# Patient Record
Sex: Male | Born: 1985 | Race: Black or African American | Hispanic: No | Marital: Single | Smoking: Former smoker
Health system: Southern US, Community
[De-identification: ages and names within clinical notes are randomized; demographics above are authoritative.]

## PROBLEM LIST (undated history)

## (undated) DIAGNOSIS — I10 Essential (primary) hypertension: Secondary | ICD-10-CM

---
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
## 2007-03-16 ENCOUNTER — Emergency Department: Payer: Self-pay | Admitting: Emergency Medicine

## 2010-08-27 ENCOUNTER — Emergency Department: Payer: Self-pay | Admitting: Emergency Medicine

## 2010-08-30 ENCOUNTER — Emergency Department: Payer: Self-pay | Admitting: Emergency Medicine
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
## 2012-02-20 ENCOUNTER — Emergency Department: Payer: Self-pay | Admitting: Emergency Medicine

## 2013-05-07 ENCOUNTER — Emergency Department: Payer: Self-pay | Admitting: Emergency Medicine

## 2015-09-30 ENCOUNTER — Other Ambulatory Visit: Payer: Self-pay | Admitting: Nephrology

## 2015-09-30 DIAGNOSIS — R944 Abnormal results of kidney function studies: Secondary | ICD-10-CM

## 2015-10-06 ENCOUNTER — Ambulatory Visit: Payer: Self-pay

## 2016-12-11 ENCOUNTER — Ambulatory Visit (HOSPITAL_COMMUNITY)
Admission: EM | Admit: 2016-12-11 | Discharge: 2016-12-11 | Disposition: A | Discharge status: Home / Self Care | Payer: Self-pay | Attending: Family Medicine | Admitting: Family Medicine

## 2016-12-11 ENCOUNTER — Encounter (HOSPITAL_COMMUNITY): Payer: Self-pay | Admitting: Emergency Medicine

## 2016-12-11 DIAGNOSIS — Z202 Contact with and (suspected) exposure to infections with a predominantly sexual mode of transmission: Secondary | ICD-10-CM

## 2016-12-11 DIAGNOSIS — Z113 Encounter for screening for infections with a predominantly sexual mode of transmission: Secondary | ICD-10-CM

## 2016-12-11 DIAGNOSIS — F17210 Nicotine dependence, cigarettes, uncomplicated: Secondary | ICD-10-CM | POA: Insufficient documentation

## 2016-12-11 MED ORDER — METRONIDAZOLE 500 MG PO TABS: 500.0000 mg | ORAL_TABLET | Freq: Two times a day (BID) | ORAL | 0 refills | Status: AC

## 2016-12-11 NOTE — ED Provider Notes (Signed)
CSN: [id number]     Arrival date & time 12/11/16  1349 History   First MD Initiated Contact with Patient 12/11/16 1447     Chief Complaint  Patient presents with  . Exposure to STD   (Consider location/radiation/quality/duration/timing/severity/associated sxs/prior Treatment) Patient states he was told by his sexual partner that she tested positive for trich at the doctors office and he wants to be treated and be tested.  He denies any sx's.   The history is provided by the patient.  Exposure to STD  This is a new problem. The problem occurs constantly. The problem has not changed since onset.Nothing aggravates the symptoms. Nothing relieves the symptoms. He has tried nothing for the symptoms.    History reviewed. No pertinent past medical history. History reviewed. No pertinent surgical history. History reviewed. No pertinent family history. Social History  Substance Use Topics  . Smoking status: Current Some Day Smoker    Packs/day: 0.50    Years: 3.00    Types: Cigarettes  . Smokeless tobacco: Never Used  . Alcohol use No    Review of Systems  Constitutional: Negative.   HENT: Negative.   Eyes: Negative.   Respiratory: Negative.   Cardiovascular: Negative.   Gastrointestinal: Negative.   Endocrine: Negative.   Genitourinary: Negative.   Musculoskeletal: Negative.   Allergic/Immunologic: Negative.   Neurological: Negative.   Hematological: Negative.     Allergies  Patient has no known allergies.  Home Medications   Prior to Admission medications   Medication Sig Start Date End Date Taking? Authorizing Provider  metroNIDAZOLE (FLAGYL) 500 MG tablet Take 1 tablet (500 mg total) by mouth 2 (two) times daily. 12/11/16   Deatra CanterWilliam J Cianna Kasparian, FNP   Meds Ordered and Administered this Visit  Medications - No data to display  BP 152/100 (BP Location: Right Arm)   Pulse 110   Temp 98.7 F (37.1 C) (Oral)   Resp 18   SpO2 99%  No data found.   Physical Exam   Constitutional: He is oriented to person, place, and time. He appears well-developed and well-nourished.  HENT:  Head: Normocephalic and atraumatic.  Eyes: Conjunctivae and EOM are normal. Pupils are equal, round, and reactive to light.  Neck: Normal range of motion. Neck supple.  Cardiovascular: Normal rate, regular rhythm and normal heart sounds.   Pulmonary/Chest: Effort normal and breath sounds normal.  Abdominal: Soft. Bowel sounds are normal.  Genitourinary: Penis normal.  Musculoskeletal: Normal range of motion.  Neurological: He is alert and oriented to person, place, and time.  Nursing note and vitals reviewed.   Urgent Care Course     Procedures (including critical care time)  Labs Review Labs Reviewed  URINE CYTOLOGY ANCILLARY ONLY    Imaging Review No results found.   Visual Acuity Review  Right Eye Distance:   Left Eye Distance:   Bilateral Distance:    Right Eye Near:   Left Eye Near:    Bilateral Near:         MDM   1. Possible exposure to STD   2. Screen for STD (sexually transmitted disease)   3. Trichomonas contact    Flagyl 500mg  one po bid x 7 days #14  Urine cytology GC/ Chlamydia Trick    Deatra CanterWilliam J Ainsleigh Kakos, FNP 12/11/16 1456

## 2016-12-11 NOTE — Discharge Instructions (Signed)
Trichomoniasis °Trichomoniasis is an infection caused by an organism called Trichomonas. The infection can affect both women and men. In women, the outer male genitalia and the vagina are affected. In men, the penis is mainly affected, but the prostate and other reproductive organs can also be involved. Trichomoniasis is a sexually transmitted infection (STI) and is most often passed to another person through sexual contact.  °RISK FACTORS °Having unprotected sexual intercourse. °Having sexual intercourse with an infected partner. °SIGNS AND SYMPTOMS  °Symptoms of trichomoniasis in women include: °Abnormal gray-green frothy vaginal discharge. °Itching and irritation of the vagina. °Itching and irritation of the area outside the vagina. °Symptoms of trichomoniasis in men include:  °Penile discharge with or without pain. °Pain during urination. This results from inflammation of the urethra. °DIAGNOSIS  °Trichomoniasis may be found during a Pap test or physical exam. Your health care provider may use one of the following methods to help diagnose this infection: °Testing the pH of the vagina with a test tape. °Using a vaginal swab test that checks for the Trichomonas organism. A test is available that provides results within a few minutes. °Examining a urine sample. °Testing vaginal secretions. °Your health care provider may test you for other STIs, including HIV. °TREATMENT  °You may be given medicine to fight the infection. Women should inform their health care provider if they could be or are pregnant. Some medicines used to treat the infection should not be taken during pregnancy. °Your health care provider may recommend over-the-counter medicines or creams to decrease itching or irritation. °Your sexual partner will need to be treated if infected. °Your health care provider may test you for infection again 3 months after treatment. °HOME CARE INSTRUCTIONS  °Take medicines only as directed by your health care  provider. °Take over-the-counter medicine for itching or irritation as directed by your health care provider. °Do not have sexual intercourse while you have the infection. °Women should not douche or wear tampons while they have the infection. °Discuss your infection with your partner. Your partner may have gotten the infection from you, or you may have gotten it from your partner. °Have your sex partner get examined and treated if necessary. °Practice safe, informed, and protected sex. °See your health care provider for other STI testing. °SEEK MEDICAL CARE IF:  °You still have symptoms after you finish your medicine. °You develop abdominal pain. °You have pain when you urinate. °You have bleeding after sexual intercourse. °You develop a rash. °Your medicine makes you sick or makes you throw up (vomit). °MAKE SURE YOU: °Understand these instructions. °Will watch your condition. °Will get help right away if you are not doing well or get worse. °This information is not intended to replace advice given to you by your health care provider. Make sure you discuss any questions you have with your health care provider. °Document Released: 05/01/2001 Document Revised: 11/26/2014 Document Reviewed: 08/17/2013 °Elsevier Interactive Patient Education © 2017 [employer] °  °

## 2016-12-11 NOTE — ED Triage Notes (Signed)
[facility] a complaint of an exposure to an STD. The patient reported that one of his sexual partners advised him that she tested positive for Trichomonas. The patient denied any symptoms at this time.

## 2016-12-12 ENCOUNTER — Telehealth (HOSPITAL_COMMUNITY): Payer: Self-pay | Admitting: Internal Medicine

## 2016-12-12 LAB — URINE CYTOLOGY ANCILLARY ONLY
Chlamydia: POSITIVE — AB
Neisseria Gonorrhea: NEGATIVE
Trichomonas: NEGATIVE

## 2016-12-12 MED ORDER — AZITHROMYCIN 500 MG PO TABS: 1000.0000 mg | ORAL_TABLET | Freq: Once | ORAL | 0 refills | Status: AC

## 2016-12-12 NOTE — Telephone Encounter (Signed)
Clinical staff, please let patient and health department know that test for chlamydia was positive.  Rx zithromax was sent to the pharmacy of record, [facility] on [address] in [place].  Sexual partners need to be notified and tested/treated.  Recheck for further evaluation as needed.  LM

## 2017-09-02 ENCOUNTER — Emergency Department
Admission: EM | Admit: 2017-09-02 | Discharge: 2017-09-02 | Disposition: A | Discharge status: Home / Self Care | Payer: Self-pay | Attending: Emergency Medicine | Admitting: Emergency Medicine

## 2017-09-02 ENCOUNTER — Encounter: Payer: Self-pay | Admitting: Emergency Medicine

## 2017-09-02 DIAGNOSIS — F17210 Nicotine dependence, cigarettes, uncomplicated: Secondary | ICD-10-CM | POA: Insufficient documentation

## 2017-09-02 DIAGNOSIS — W57XXXA Bitten or stung by nonvenomous insect and other nonvenomous arthropods, initial encounter: Secondary | ICD-10-CM

## 2017-09-02 DIAGNOSIS — L089 Local infection of the skin and subcutaneous tissue, unspecified: Secondary | ICD-10-CM | POA: Insufficient documentation

## 2017-09-02 DIAGNOSIS — X58XXXA Exposure to other specified factors, initial encounter: Secondary | ICD-10-CM | POA: Insufficient documentation

## 2017-09-02 DIAGNOSIS — I10 Essential (primary) hypertension: Secondary | ICD-10-CM | POA: Insufficient documentation

## 2017-09-02 DIAGNOSIS — Z79899 Other long term (current) drug therapy: Secondary | ICD-10-CM | POA: Insufficient documentation

## 2017-09-02 DIAGNOSIS — Y999 Unspecified external cause status: Secondary | ICD-10-CM | POA: Insufficient documentation

## 2017-09-02 DIAGNOSIS — Y939 Activity, unspecified: Secondary | ICD-10-CM | POA: Insufficient documentation

## 2017-09-02 DIAGNOSIS — Y929 Unspecified place or not applicable: Secondary | ICD-10-CM | POA: Insufficient documentation

## 2017-09-02 DIAGNOSIS — S70361A Insect bite (nonvenomous), right thigh, initial encounter: Secondary | ICD-10-CM | POA: Insufficient documentation

## 2017-09-02 MED ORDER — NAPROXEN 500 MG PO TABS: 500.0000 mg | ORAL_TABLET | Freq: Two times a day (BID) | ORAL | Status: DC

## 2017-09-02 MED ORDER — NAPROXEN 500 MG PO TABS: 500.0000 mg | ORAL_TABLET | Freq: Two times a day (BID) | ORAL | Status: AC

## 2017-09-02 MED ORDER — SULFAMETHOXAZOLE-TRIMETHOPRIM 800-160 MG PO TABS
1.0000 | ORAL_TABLET | Freq: Once | ORAL | Status: AC
  Administered 2017-09-02: 1 via ORAL
  Filled 2017-09-02: qty 1

## 2017-09-02 MED ORDER — CLONIDINE HCL 0.1 MG PO TABS
0.2000 mg | ORAL_TABLET | Freq: Once | ORAL | Status: AC
  Administered 2017-09-02: 0.2 mg via ORAL
  Filled 2017-09-02: qty 2

## 2017-09-02 MED ORDER — SULFAMETHOXAZOLE-TRIMETHOPRIM 800-160 MG PO TABS: 1.0000 | ORAL_TABLET | Freq: Two times a day (BID) | ORAL | 0 refills | Status: DC

## 2017-09-02 MED ORDER — HYDROCHLOROTHIAZIDE 50 MG PO TABS: 50.0000 mg | ORAL_TABLET | Freq: Every day | ORAL | 0 refills | Status: AC

## 2017-09-02 MED ORDER — SULFAMETHOXAZOLE-TRIMETHOPRIM 800-160 MG PO TABS: 1.0000 | ORAL_TABLET | Freq: Two times a day (BID) | ORAL | 0 refills | Status: AC

## 2017-09-02 MED ORDER — NAPROXEN 500 MG PO TABS
500.0000 mg | ORAL_TABLET | Freq: Once | ORAL | Status: AC
Start: 2017-09-02 — End: 2017-09-02
  Administered 2017-09-02: 500 mg via ORAL
  Filled 2017-09-02: qty 1

## 2017-09-02 NOTE — ED Provider Notes (Signed)
[facility] Emergency Department Provider Note   ____________________________________________   First MD Initiated Contact with Patient 09/02/17 1411     (approximate)  I have reviewed the triage vital signs and the nursing notes.   HISTORY  Chief Complaint Abscess    HPI Robert Juarez is a 31 y.o. male patient presents with for red swollen area to the anterior right thigh. Patient stated medical bed by to insect bite 5 days ago. Patient stated 2 days ago he tried to express fluid from the site resulting in increased swelling and redness.patient rates pain as a 10 over 10. Patient had a pain as "achy". Patient also presented with a blood pressure 189/130. Patient admits to noncompliance of unknown blood pressure medication for over 6 months. Review of patient's medical record does not name a blood pressure medication.   History reviewed. No pertinent past medical history.  There are no active problems to display for this patient.   History reviewed. No pertinent surgical history.  Prior to Admission medications   Medication Sig Start Date End Date Taking? Authorizing Provider  hydrochlorothiazide (HYDRODIURIL) 50 MG tablet Take 1 tablet (50 mg total) by mouth daily. 09/02/17   Joni Reining, PA-C  metroNIDAZOLE (FLAGYL) 500 MG tablet Take 1 tablet (500 mg total) by mouth 2 (two) times daily. 12/11/16   Deatra Canter, FNP  naproxen (NAPROSYN) 500 MG tablet Take 1 tablet (500 mg total) by mouth 2 (two) times daily with a meal. 09/02/17   Joni Reining, PA-C  sulfamethoxazole-trimethoprim (BACTRIM DS,SEPTRA DS) 800-160 MG tablet Take 1 tablet by mouth 2 (two) times daily. 09/02/17   Joni Reining, PA-C    Allergies Patient has no known allergies.  No family history on file.  Social History Social History  Substance Use Topics  . Smoking status: Current Some Day Smoker    Packs/day: 0.50    Years: 3.00    Types: Cigarettes  . Smokeless  tobacco: Never Used  . Alcohol use No    Review of Systems Constitutional: No fever/chills Eyes: No visual changes. ENT: No sore throat. Cardiovascular: Denies chest pain. Respiratory: Denies shortness of breath. Gastrointestinal: No abdominal pain.  No nausea, no vomiting.  No diarrhea.  No constipation. Genitourinary: Negative for dysuria. Musculoskeletal: Negative for back pain. Skin: redness and swelling right anterior thigh Neurological: Negative for headaches, focal weakness or numbness. Endocrine:hypertension ____________________________________________   PHYSICAL EXAM:  VITAL SIGNS: ED Triage Vitals  Enc Vitals Group     BP 09/02/17 1244 (!) 215/115     Pulse Rate 09/02/17 1244 (!) 101     Resp 09/02/17 1244 18     Temp 09/02/17 1244 98.8 F (37.1 C)     Temp Source 09/02/17 1244 Oral     SpO2 09/02/17 1244 98 %     Weight 09/02/17 1241 260 lb (117.9 kg)     Height 09/02/17 1241  (1.778 m)     Head Circumference --      Peak Flow --      Pain Score 09/02/17 1241 10     Pain Loc --      Pain Edu? --      Excl. in GC? --    Constitutional: Alert and oriented. Well appearing and in no acute distress. Hematological/Lymphatic/Immunilogical: No cervical lymphadenopathy. Cardiovascular: Normal rate, regular rhythm. Grossly normal heart sounds.  Good peripheral circulation.elevated blood pressure Respiratory: Normal respiratory effort.  No retractions. Lungs CTAB. Gastrointestinal: Soft  and nontender. No distention. No abdominal bruits. No CVA tenderness. Musculoskeletal: No lower extremity tenderness nor edema.  No joint effusions. Neurologic:  Normal speech and language. No gross focal neurologic deficits are appreciated. No gait instability. Skin:  Papular lesion on erythematous edematous base of the right anterior thigh. Psychiatric: Mood and affect are normal. Speech and behavior are normal.  ____________________________________________   LABS (all  labs ordered are listed, but only abnormal results are displayed)  Labs Reviewed - No data to display ____________________________________________  EKG   ____________________________________________  RADIOLOGY  No results found.  ____________________________________________   PROCEDURES  Procedure(s) performed: None  Procedures  Critical Care performed: No  ____________________________________________   INITIAL IMPRESSION / ASSESSMENT AND PLAN / ED COURSE  As part of my medical decision making, I reviewed the following data within the electronic MEDICAL RECORD NUMBER    Patient presents pain to right thigh secondary to infected insect bite. Patient also hypertension with positive medication. Patient advised to [facility] and take medications as directed.      ____________________________________________   FINAL CLINICAL IMPRESSION(S) / ED DIAGNOSES  Final diagnoses:  Bug bite with infection, initial encounter  Essential hypertension      NEW MEDICATIONS STARTED DURING THIS VISIT:  New Prescriptions   HYDROCHLOROTHIAZIDE (HYDRODIURIL) 50 MG TABLET    Take 1 tablet (50 mg total) by mouth daily.   NAPROXEN (NAPROSYN) 500 MG TABLET    Take 1 tablet (500 mg total) by mouth 2 (two) times daily with a meal.   SULFAMETHOXAZOLE-TRIMETHOPRIM (BACTRIM DS,SEPTRA DS) 800-160 MG TABLET    Take 1 tablet by mouth 2 (two) times daily.     Note:  This document was prepared using Dragon voice recognition software and may include unintentional dictation errors.    Joni Reining, PA-C 09/02/17 1521    Jene Every, MD 09/05/17 [phone]

## 2017-09-02 NOTE — ED Triage Notes (Signed)
Patient presents to the ED with reddened swollen area to his right thigh.  Patient states, "I think it might be an insect bite."  Patient reports noticing area approx. 2 days ago.  Patient is in no obvious distress at this time.

## 2017-09-02 NOTE — ED Notes (Signed)
See triage note.  Pt has an area of redness to right upper thigh.  Pt reports he thinks it is an insect bite, but is unsure.  Area red and warm to touch.  Pt in NAD, A&Ox4, ambulatory from triage.

## 2021-06-12 ENCOUNTER — Encounter: Payer: Self-pay | Admitting: *Deleted

## 2021-06-12 ENCOUNTER — Other Ambulatory Visit: Payer: Self-pay

## 2021-06-12 DIAGNOSIS — Z87891 Personal history of nicotine dependence: Secondary | ICD-10-CM | POA: Insufficient documentation

## 2021-06-12 DIAGNOSIS — L03319 Cellulitis of trunk, unspecified: Secondary | ICD-10-CM | POA: Insufficient documentation

## 2021-06-12 DIAGNOSIS — I10 Essential (primary) hypertension: Secondary | ICD-10-CM | POA: Insufficient documentation

## 2021-06-12 DIAGNOSIS — Z79899 Other long term (current) drug therapy: Secondary | ICD-10-CM | POA: Insufficient documentation

## 2021-06-12 NOTE — ED Triage Notes (Signed)
Pt has swelling redness to left nipple/breast area for 3 days.  No discharge from nipple.  Area tender to touch.  Pt alert

## 2021-06-13 ENCOUNTER — Emergency Department
Admission: EM | Admit: 2021-06-13 | Discharge: 2021-06-13 | Disposition: A | Discharge status: Home / Self Care | Payer: Self-pay | Attending: Emergency Medicine | Admitting: Emergency Medicine

## 2021-06-13 ENCOUNTER — Encounter: Payer: Self-pay | Admitting: Emergency Medicine

## 2021-06-13 DIAGNOSIS — L03319 Cellulitis of trunk, unspecified: Secondary | ICD-10-CM

## 2021-06-13 HISTORY — DX: Essential (primary) hypertension: I10

## 2021-06-13 MED ORDER — CEPHALEXIN 500 MG PO CAPS
500.0000 mg | ORAL_CAPSULE | Freq: Once | ORAL | Status: AC
  Administered 2021-06-13: 500 mg via ORAL
  Filled 2021-06-13: qty 1

## 2021-06-13 MED ORDER — CEPHALEXIN 500 MG PO CAPS: 500.0000 mg | ORAL_CAPSULE | Freq: Three times a day (TID) | ORAL | 0 refills | Status: AC

## 2021-06-13 NOTE — Discharge Instructions (Signed)
1.  Take antibiotic as prescribed (Keflex 500 mg  3 times daily x7 days). 2.  Apply warm, moist compress to affected area several times daily. 3.  Return to the ER for worsening symptoms, persistent vomiting, difficulty breathing or other concerns.

## 2021-06-13 NOTE — ED Notes (Signed)
NAD noted at time of D/C. Pt denies questions or concerns. Pt ambulatory to the lobby at this time. Return precautions reviewed with patient at this time, pt instructed to f/u with PCP for his HTN, EDP aware of BP at time of D/C.

## 2021-06-13 NOTE — ED Provider Notes (Signed)
[facility] Emergency Department Provider Note   ____________________________________________   Event Date/Time   First MD Initiated Contact with Patient 06/13/21 0235     (approximate)  I have reviewed the triage vital signs and the nursing notes.   HISTORY  Chief Complaint Breast Problem    HPI Robert Juarez is a 35 y.o. male who presents to the ED from home with a chief complaint of redness and swelling to his left nipple/breast area x3 days.  Denies injury, biting, scratching, known insect bite.  Does not have piercing.  Denies fever, chills, malaise, nausea, vomiting or dizziness.  Denies nipple discharge.     Past Medical History:  Diagnosis Date  . Hypertension     There are no problems to display for this patient.   No past surgical history on file.  Prior to Admission medications   Medication Sig Start Date End Date Taking? Authorizing Provider  cephALEXin (KEFLEX) 500 MG capsule Take 1 capsule (500 mg total) by mouth 3 (three) times daily. 06/13/21  Yes Irean Hong, MD  hydrochlorothiazide (HYDRODIURIL) 50 MG tablet Take 1 tablet (50 mg total) by mouth daily. 09/02/17   Joni Reining, PA-C  metroNIDAZOLE (FLAGYL) 500 MG tablet Take 1 tablet (500 mg total) by mouth 2 (two) times daily. 12/11/16   Deatra Canter, FNP  naproxen (NAPROSYN) 500 MG tablet Take 1 tablet (500 mg total) by mouth 2 (two) times daily with a meal. 09/02/17   Joni Reining, PA-C  sulfamethoxazole-trimethoprim (BACTRIM DS,SEPTRA DS) 800-160 MG tablet Take 1 tablet by mouth 2 (two) times daily. 09/02/17   Joni Reining, PA-C    Allergies Patient has no known allergies.  No family history on file.  Social History Social History   Tobacco Use  . Smoking status: Former    Packs/day: 0.50    Years: 3.00    Pack years: 1.50    Types: Cigarettes  . Smokeless tobacco: Never  Substance Use Topics  . Alcohol use: Yes  . Drug use: No    Review of  Systems  Constitutional: No fever/chills Eyes: No visual changes. ENT: No sore throat. Cardiovascular: Denies chest pain. Respiratory: Denies shortness of breath. Gastrointestinal: No abdominal pain.  No nausea, no vomiting.  No diarrhea.  No constipation. Genitourinary: Negative for dysuria. Musculoskeletal: Negative for back pain. Skin: Positive for redness/swelling to left nipple.  Negative for rash. Neurological: Negative for headaches, focal weakness or numbness.   ____________________________________________   PHYSICAL EXAM:  VITAL SIGNS: ED Triage Vitals [06/12/21 2326]  Enc Vitals Group     BP (!) 164/137     Pulse Rate 77     Resp 18     Temp 98.6 F (37 C)     Temp Source Oral     SpO2 99 %     Weight 240 lb (108.9 kg)     Height 5\' 10"  (1.778 m)     Head Circumference      Peak Flow      Pain Score 7     Pain Loc      Pain Edu?      Excl. in GC?     Constitutional: Alert and oriented. Well appearing and in no acute distress. Eyes: Conjunctivae are normal. PERRL. EOMI. Head: Atraumatic. Nose: No congestion/rhinnorhea. Mouth/Throat: Mucous membranes are moist.   Neck: No stridor.   Cardiovascular: Normal rate, regular rhythm. Grossly normal heart sounds.  Good peripheral circulation. Respiratory: Normal  respiratory effort.  No retractions. Lungs CTAB. Breasts: Right breast unremarkable.  Left breast: Unremarkable nipple.  No nipple discharge.  Surrounding areolar region with mild erythema and warmth.  No streaking.  No fluctuance.  No left axillary lymphadenopathy.  No palpable masses. Gastrointestinal: Soft and nontender. No distention. No abdominal bruits. No CVA tenderness. Musculoskeletal: No lower extremity tenderness nor edema.  No joint effusions. Neurologic:  Normal speech and language. No gross focal neurologic deficits are appreciated. No gait instability. Skin:  Skin is warm, dry and intact. No rash noted. Psychiatric: Mood and affect are  normal. Speech and behavior are normal.  ____________________________________________   LABS (all labs ordered are listed, but only abnormal results are displayed)  Labs Reviewed - No data to display ____________________________________________  EKG  None ____________________________________________  RADIOLOGY I, Jonmarc Bodkin J, personally viewed and evaluated these images (plain radiographs) as part of my medical decision making, as well as reviewing the written report by the radiologist.  ED MD interpretation: None  Official radiology report(s): No results found.  ____________________________________________   PROCEDURES  Procedure(s) performed (including Critical Care):  Procedures   ____________________________________________   INITIAL IMPRESSION / ASSESSMENT AND PLAN / ED COURSE  As part of my medical decision making, I reviewed the following data within the electronic MEDICAL RECORD NUMBER Nursing notes reviewed and incorporated and Notes from prior ED visits     35 year old male presenting with redness and swelling to left nipple.  No abscess on clinical examination.  Will start Keflex, advised warm compresses.  Will refer to general surgery for outpatient follow-up.  Patient has a history of hypertension and will keep an eye on his blood pressure.  Strict return precautions given.  Patient verbalizes understanding agrees with plan of care.      ____________________________________________   FINAL CLINICAL IMPRESSION(S) / ED DIAGNOSES  Final diagnoses:  Cellulitis of trunk, unspecified site of trunk     ED Discharge Orders          Ordered    cephALEXin (KEFLEX) 500 MG capsule  3 times daily        06/13/21 0241             Note:  This document was prepared using Dragon voice recognition software and may include unintentional dictation errors.    Irean Hong, MD 06/13/21 [phone]

## 2021-06-13 NOTE — ED Notes (Addendum)
Dr. Dolores Frame to triage room to assess patient. Pt states L breast pain since Friday. Pt with noted redness around L nipple. Denies fever/N/V. Pt denies known injury, denies discharge from nipple at this time.

## 2022-01-12 ENCOUNTER — Emergency Department: Payer: Self-pay

## 2022-01-12 ENCOUNTER — Emergency Department
Admission: EM | Admit: 2022-01-12 | Discharge: 2022-01-12 | Disposition: A | Discharge status: Home / Self Care | Payer: Self-pay | Attending: Emergency Medicine | Admitting: Emergency Medicine

## 2022-01-12 ENCOUNTER — Other Ambulatory Visit: Payer: Self-pay

## 2022-01-12 ENCOUNTER — Encounter: Payer: Self-pay | Admitting: *Deleted

## 2022-01-12 DIAGNOSIS — S01112A Laceration without foreign body of left eyelid and periocular area, initial encounter: Secondary | ICD-10-CM | POA: Insufficient documentation

## 2022-01-12 DIAGNOSIS — S065X0A Traumatic subdural hemorrhage without loss of consciousness, initial encounter: Secondary | ICD-10-CM | POA: Insufficient documentation

## 2022-01-12 DIAGNOSIS — I10 Essential (primary) hypertension: Secondary | ICD-10-CM | POA: Insufficient documentation

## 2022-01-12 DIAGNOSIS — Z23 Encounter for immunization: Secondary | ICD-10-CM | POA: Insufficient documentation

## 2022-01-12 DIAGNOSIS — S022XXA Fracture of nasal bones, initial encounter for closed fracture: Secondary | ICD-10-CM | POA: Insufficient documentation

## 2022-01-12 LAB — COMPREHENSIVE METABOLIC PANEL
ALT: 28 U/L (ref 0–44)
AST: 35 U/L (ref 15–41)
Albumin: 4.5 g/dL (ref 3.5–5.0)
Alkaline Phosphatase: 66 U/L (ref 38–126)
Anion gap: 10 (ref 5–15)
BUN: 15 mg/dL (ref 6–20)
CO2: 21 mmol/L — ABNORMAL LOW (ref 22–32)
Calcium: 8.9 mg/dL (ref 8.9–10.3)
Chloride: 105 mmol/L (ref 98–111)
Creatinine, Ser: 1.78 mg/dL — ABNORMAL HIGH (ref 0.61–1.24)
GFR, Estimated: 51 mL/min — ABNORMAL LOW (ref >60)
Glucose, Bld: 120 mg/dL — ABNORMAL HIGH (ref 70–99)
Potassium: 3.3 mmol/L — ABNORMAL LOW (ref 3.5–5.1)
Sodium: 136 mmol/L (ref 135–145)
Total Bilirubin: 0.5 mg/dL (ref 0.3–1.2)
Total Protein: 8.6 g/dL — ABNORMAL HIGH (ref 6.5–8.1)

## 2022-01-12 LAB — CBC WITH DIFFERENTIAL/PLATELET
Abs Immature Granulocytes: 0.16 10*3/uL — ABNORMAL HIGH (ref 0.00–0.07)
Basophils Absolute: 0 10*3/uL (ref 0.0–0.1)
Basophils Relative: 0 %
Eosinophils Absolute: 0.2 10*3/uL (ref 0.0–0.5)
Eosinophils Relative: 1 %
HCT: 48.7 % (ref 39.0–52.0)
Hemoglobin: 16.4 g/dL (ref 13.0–17.0)
Immature Granulocytes: 1 %
Lymphocytes Relative: 32 %
Lymphs Abs: 3.6 10*3/uL (ref 0.7–4.0)
MCH: 30.8 pg (ref 26.0–34.0)
MCHC: 33.7 g/dL (ref 30.0–36.0)
MCV: 91.5 fL (ref 80.0–100.0)
Monocytes Absolute: 0.6 10*3/uL (ref 0.1–1.0)
Monocytes Relative: 5 %
Neutro Abs: 6.7 10*3/uL (ref 1.7–7.7)
Neutrophils Relative %: 61 %
Platelets: 348 10*3/uL (ref 150–400)
RBC: 5.32 MIL/uL (ref 4.22–5.81)
RDW: 11.9 % (ref 11.5–15.5)
WBC: 11.2 10*3/uL — ABNORMAL HIGH (ref 4.0–10.5)
nRBC: 0 % (ref 0.0–0.2)

## 2022-01-12 LAB — URINALYSIS, COMPLETE (UACMP) WITH MICROSCOPIC
Bilirubin Urine: NEGATIVE
Glucose, UA: NEGATIVE mg/dL
Ketones, ur: NEGATIVE mg/dL
Leukocytes,Ua: NEGATIVE
Nitrite: NEGATIVE
Protein, ur: NEGATIVE mg/dL
Specific Gravity, Urine: 1.009 (ref 1.005–1.030)
pH: 5 (ref 5.0–8.0)

## 2022-01-12 LAB — URINE DRUG SCREEN, QUALITATIVE (ARMC ONLY)
Amphetamines, Ur Screen: NOT DETECTED
Barbiturates, Ur Screen: NOT DETECTED
Benzodiazepine, Ur Scrn: NOT DETECTED
Cannabinoid 50 Ng, Ur ~~LOC~~: NOT DETECTED
Cocaine Metabolite,Ur ~~LOC~~: NOT DETECTED
MDMA (Ecstasy)Ur Screen: NOT DETECTED
Methadone Scn, Ur: NOT DETECTED
Opiate, Ur Screen: NOT DETECTED
Phencyclidine (PCP) Ur S: NOT DETECTED
Tricyclic, Ur Screen: NOT DETECTED

## 2022-01-12 LAB — ETHANOL: Alcohol, Ethyl (B): 230 mg/dL — ABNORMAL HIGH (ref <10)

## 2022-01-12 MED ORDER — MORPHINE SULFATE (PF) 4 MG/ML IV SOLN
4.0000 mg | Freq: Once | INTRAVENOUS | Status: AC
  Administered 2022-01-12: 4 mg via INTRAVENOUS
  Filled 2022-01-12: qty 1

## 2022-01-12 MED ORDER — LORAZEPAM 2 MG/ML IJ SOLN
1.0000 mg | Freq: Once | INTRAMUSCULAR | Status: AC
  Administered 2022-01-12: 1 mg via INTRAVENOUS

## 2022-01-12 MED ORDER — OXYCODONE-ACETAMINOPHEN 5-325 MG PO TABS: 1.0000 | ORAL_TABLET | Freq: Four times a day (QID) | ORAL | 0 refills | Status: AC | PRN

## 2022-01-12 MED ORDER — SODIUM CHLORIDE 0.9 % IV BOLUS
1000.0000 mL | Freq: Once | INTRAVENOUS | Status: AC
  Administered 2022-01-12: 1000 mL via INTRAVENOUS

## 2022-01-12 MED ORDER — LACTATED RINGERS IV BOLUS
1000.0000 mL | Freq: Once | INTRAVENOUS | Status: AC
  Administered 2022-01-12: 1000 mL via INTRAVENOUS

## 2022-01-12 MED ORDER — TETANUS-DIPHTH-ACELL PERTUSSIS 5-2.5-18.5 LF-MCG/0.5 IM SUSY
0.5000 mL | PREFILLED_SYRINGE | Freq: Once | INTRAMUSCULAR | Status: AC
  Administered 2022-01-12: 0.5 mL via INTRAMUSCULAR
  Filled 2022-01-12: qty 0.5

## 2022-01-12 MED ORDER — LORAZEPAM 2 MG/ML IJ SOLN
INTRAMUSCULAR | Status: AC
  Filled 2022-01-12: qty 1

## 2022-01-12 MED ORDER — LEVETIRACETAM 500 MG PO TABS
500.0000 mg | ORAL_TABLET | Freq: Once | ORAL | Status: AC
  Administered 2022-01-12: 500 mg via ORAL
  Filled 2022-01-12: qty 1

## 2022-01-12 MED ORDER — LIDOCAINE HCL (PF) 1 % IJ SOLN
5.0000 mL | Freq: Once | INTRAMUSCULAR | Status: AC
  Administered 2022-01-12: 5 mL via INTRADERMAL
  Filled 2022-01-12: qty 5

## 2022-01-12 MED ORDER — LEVETIRACETAM 500 MG PO TABS: 500.0000 mg | ORAL_TABLET | Freq: Two times a day (BID) | ORAL | 0 refills | Status: AC

## 2022-01-12 MED ORDER — IOHEXOL 300 MG/ML  SOLN
125.0000 mL | Freq: Once | INTRAMUSCULAR | Status: AC | PRN
  Administered 2022-01-12: 125 mL via INTRAVENOUS

## 2022-01-12 NOTE — ED Notes (Signed)
RN at bedside to clean pt's face. Pt and mother updated on POC. Pt provided mouth swabs at this time.
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
## 2022-01-12 NOTE — ED Provider Notes (Signed)
..  Laceration Repair  Date/Time: 01/12/2022 11:12 AM Performed by: Sharman Cheek, MD Authorized by: Sharman Cheek, MD   Consent:    Consent obtained:  Verbal   Consent given by:  Patient   Risks discussed:  Infection, pain, retained foreign body, poor cosmetic result and poor wound healing Universal protocol:    Imaging studies available: yes     Patient identity confirmed:  Verbally with patient Anesthesia:    Anesthesia method:  Local infiltration   Local anesthetic:  Lidocaine 1% w/o epi Laceration details:    Location:  Face   Face location:  L eyebrow   Length (cm):  3 Pre-procedure details:    Preparation:  Patient was prepped and draped in usual sterile fashion and imaging obtained to evaluate for foreign bodies Exploration:    Hemostasis achieved with:  Direct pressure   Imaging obtained comment:  CT   Imaging outcome: foreign body not noted     Wound exploration: entire depth of wound visualized     Wound extent: no foreign bodies/material noted, no muscle damage noted, no nerve damage noted, no tendon damage noted, no underlying fracture noted and no vascular damage noted     Contaminated: no   Treatment:    Area cleansed with:  Saline and povidone-iodine   Amount of cleaning:  Extensive   Irrigation solution:  Sterile saline   Visualized foreign bodies/material removed: no     Debridement:  None   Undermining:  Minimal   Scar revision: no   Skin repair:    Repair method:  Sutures   Suture size:  4-0   Wound skin closure material used: monocryl.   Suture technique:  Simple interrupted   Number of sutures:  5 Approximation:    Approximation:  Close Repair type:    Repair type:  Intermediate Post-procedure details:    Dressing:  Sterile dressing   Procedure completion:  Tolerated well, no immediate complications Comments:     Two 4-0 Monocryl simple interrupted sutures were placed over full-thickness skin laceration at the medial aspect of the wound.   The lateral aspect of the wound was partial-thickness laceration with a superficial skin flap that had been displaced.  This was cleaned, gently reapproximated and repaired with three 5-0 Monocryl sutures in simple interrupted fashion.     ----------------------------------------- 11:08 AM on 01/12/2022 -----------------------------------------  Assumed care from Dr. Don Perking pending repeat CT scan this morning.  Repeat CT scan shows stable subdural hematoma, 3 mm thickness, not enlarging.  Per neurosurgery recommendations as discussed by Dr. Don Perking, this is does not require admission or immediate surgical evaluation and is stable for discharge and follow-up.  I also informed patient and his mother at bedside of nasal bone fracture and nasal precautions and need to follow-up with ENT.  On reassessment, patient's mental status is improving.  He has clear speech, he is oriented.  He is tolerating oral intake.  I discussed with him the laceration at his left supraorbital ridge, and he is now amenable to repair and cooperative, so laceration repair was performed.  The lateral half of the wound has a superficial skin flap, which was reapproximated and sutured.  Patient and mother were warned that this skin may not have sufficient blood supply to heal and may slough as the underlying wound heals, but can provide a biologic wound dressing in the meantime.  Stable for discharge.    Sharman Cheek, MD 01/12/22 1116

## 2022-01-12 NOTE — ED Notes (Signed)
Report off to julie rn

## 2022-01-12 NOTE — ED Notes (Signed)
Pt return from scan.  Pt drowsy.  Iv fluids infusing.  Sinus tach on monitor.

## 2022-01-12 NOTE — ED Notes (Signed)
Pt to ct scan with rn.  
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
## 2022-01-12 NOTE — ED Notes (Signed)
Pt transported to CT ?
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
## 2022-01-12 NOTE — ED Notes (Signed)
ED Provider at bedside. Dr. Joni Fears at bedside, suturing pt's eyelid/forehead. This RN introduced herself to pt's mother who denies any needs currently. Pt. Is resting comfortably during suturing.

## 2022-01-12 NOTE — ED Notes (Signed)
Lac cart at bedside for MD

## 2022-01-16 LAB — ABO/RH: ABO/RH(D): O POS

## 2022-02-01 LAB — TYPE AND SCREEN
ABO/RH(D): O POS
Antibody Screen: NEGATIVE

## 2022-10-01 ENCOUNTER — Other Ambulatory Visit: Payer: Self-pay

## 2022-10-01 ENCOUNTER — Encounter: Payer: Self-pay | Admitting: Emergency Medicine

## 2022-10-01 ENCOUNTER — Emergency Department
Admission: EM | Admit: 2022-10-01 | Discharge: 2022-10-01 | Disposition: A | Discharge status: Home / Self Care | Payer: No Typology Code available for payment source | Attending: Student in an Organized Health Care Education/Training Program | Admitting: Student in an Organized Health Care Education/Training Program

## 2022-10-01 ENCOUNTER — Emergency Department: Payer: No Typology Code available for payment source

## 2022-10-01 DIAGNOSIS — Y92410 Unspecified street and highway as the place of occurrence of the external cause: Secondary | ICD-10-CM | POA: Diagnosis not present

## 2022-10-01 DIAGNOSIS — S199XXA Unspecified injury of neck, initial encounter: Secondary | ICD-10-CM | POA: Diagnosis present

## 2022-10-01 DIAGNOSIS — M545 Low back pain, unspecified: Secondary | ICD-10-CM | POA: Diagnosis not present

## 2022-10-01 DIAGNOSIS — S161XXA Strain of muscle, fascia and tendon at neck level, initial encounter: Secondary | ICD-10-CM | POA: Diagnosis not present

## 2022-10-01 MED ORDER — IBUPROFEN 600 MG PO TABS
600.0000 mg | ORAL_TABLET | Freq: Once | ORAL | Status: AC
  Administered 2022-10-01: 600 mg via ORAL
  Filled 2022-10-01: qty 1

## 2022-10-01 MED ORDER — CYCLOBENZAPRINE HCL 10 MG PO TABS
10.0000 mg | ORAL_TABLET | Freq: Once | ORAL | Status: AC
  Administered 2022-10-01: 10 mg via ORAL
  Filled 2022-10-01: qty 1

## 2022-10-01 MED ORDER — CYCLOBENZAPRINE HCL 10 MG PO TABS: 10.0000 mg | ORAL_TABLET | Freq: Three times a day (TID) | ORAL | 0 refills | Status: AC | PRN
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
